# Patient Record
Sex: Female | Born: 1985 | Race: Black or African American | Hispanic: No | Marital: Single | Smoking: Current some day smoker
Health system: Southern US, Community
[De-identification: ages and names within clinical notes are randomized; demographics above are authoritative.]

## PROBLEM LIST (undated history)

## (undated) HISTORY — PX: TONSILLECTOMY: SUR1361

---

## 2011-08-31 ENCOUNTER — Emergency Department (HOSPITAL_COMMUNITY)
Admission: EM | Admit: 2011-08-31 | Discharge: 2011-08-31 | Disposition: A | Discharge status: Home / Self Care | Payer: Self-pay | Attending: Emergency Medicine | Admitting: Emergency Medicine

## 2011-08-31 ENCOUNTER — Encounter: Payer: Self-pay | Admitting: Adult Health

## 2011-08-31 DIAGNOSIS — B029 Zoster without complications: Secondary | ICD-10-CM | POA: Insufficient documentation

## 2011-08-31 DIAGNOSIS — F17200 Nicotine dependence, unspecified, uncomplicated: Secondary | ICD-10-CM | POA: Insufficient documentation

## 2011-08-31 MED ORDER — OXYCODONE-ACETAMINOPHEN 5-325 MG PO TABS: 1.0000 | ORAL_TABLET | Freq: Four times a day (QID) | ORAL | Status: AC | PRN

## 2011-08-31 MED ORDER — VALACYCLOVIR HCL 500 MG PO TABS: 1000.0000 mg | ORAL_TABLET | Freq: Three times a day (TID) | ORAL | Status: AC

## 2011-08-31 NOTE — ED Provider Notes (Signed)
History     CSN: [id number] Arrival date & time: 08/31/2011  1:41 PM   None     Chief Complaint  Patient presents with  . Herpes Zoster    (Consider location/radiation/quality/duration/timing/severity/associated sxs/prior treatment) HPI Comments: Patient states that she has (it red bumps in her upper left torso.  She felt a burning sensation days before the rash recurred.  Now it itching and causing pain.  Patient states that she did have chickenpox as a child.  Patient has never had a rash like this before in the past and she thinks it shingles.  Patient denies fever, night sweats, chills.  She also denies the use of any new lotions or other products.  Patient denies any difficulty breathing and chest pain.  Patient has no other complaints   Patient is a 25 y.o. female presenting with rash. The history is provided by the patient.  Rash  This is a new problem. The current episode started 2 days ago. The problem has been gradually worsening. The problem is associated with nothing. There has been no fever. The rash is present on the torso. The pain is at a severity of 5/10. The pain is mild. The pain has been constant since onset. Associated symptoms include itching and pain. She has tried nothing for the symptoms.    Past Medical History  Diagnosis Date  . Asthma     History reviewed. No pertinent past surgical history.  History reviewed. No pertinent family history.  History  Substance Use Topics  . Smoking status: Current Everyday Smoker  . Smokeless tobacco: Not on file  . Alcohol Use: Yes    OB History    Grav Para Term Preterm Abortions TAB SAB Ect Mult Living                  Review of Systems  Skin: Positive for itching and rash.  All other systems reviewed and are negative.    Allergies  Review of patient's allergies indicates no known allergies.  Home Medications  No current outpatient prescriptions on file.  BP 109/60  Pulse 79  Temp 97.9 F (36.6  C)  Resp 20  SpO2 100%  Physical Exam  Nursing note and vitals reviewed. Constitutional: She is oriented to person, place, and time. She appears well-developed and well-nourished. No distress.  HENT:  Head: Normocephalic and atraumatic.  Eyes:       Normal appearance  Neck: Normal range of motion.  Pulmonary/Chest: Effort normal.  Neurological: She is alert and oriented to person, place, and time.  Skin: Skin is warm, dry and intact. Rash noted. Rash is macular and papular.     Psychiatric: She has a normal mood and affect. Her behavior is normal.    ED Course  Procedures (including critical care time)  Labs Reviewed - No data to display No results found.   No diagnosis found.  Patient being treated for possible shingles.  She has been instructed to followup with her PCP or dermatology in regards to her visit today.  The patient will be getting a work note because she works in [employer]  Discussed length of time for wk note with Dr. Ignacia Palma who suggests I write it for 3-4 days.  MDM  Shingles   Medical screening examination/treatment/procedure(s) were performed by non-physician practitioner and as supervising physician I was immediately available for consultation/collaboration. Osvaldo Human, M.D.     [place], [place] 08/31/11 1511  Carleene Cooper III, MD 08/31/11  1954 

## 2011-08-31 NOTE — ED Notes (Signed)
Raised red bumps on left upper chest unilateral. Complains of itching denies pain.

## 2014-10-20 ENCOUNTER — Encounter (HOSPITAL_COMMUNITY): Payer: Self-pay | Admitting: Emergency Medicine

## 2014-10-20 ENCOUNTER — Emergency Department (HOSPITAL_COMMUNITY)
Admission: EM | Admit: 2014-10-20 | Discharge: 2014-10-21 | Disposition: A | Discharge status: Home / Self Care | Payer: BLUE CROSS/BLUE SHIELD | Attending: Emergency Medicine | Admitting: Emergency Medicine

## 2014-10-20 DIAGNOSIS — R110 Nausea: Secondary | ICD-10-CM | POA: Insufficient documentation

## 2014-10-20 DIAGNOSIS — Z79899 Other long term (current) drug therapy: Secondary | ICD-10-CM | POA: Insufficient documentation

## 2014-10-20 DIAGNOSIS — R51 Headache: Secondary | ICD-10-CM | POA: Insufficient documentation

## 2014-10-20 DIAGNOSIS — J45909 Unspecified asthma, uncomplicated: Secondary | ICD-10-CM | POA: Insufficient documentation

## 2014-10-20 DIAGNOSIS — Z87891 Personal history of nicotine dependence: Secondary | ICD-10-CM | POA: Insufficient documentation

## 2014-10-20 DIAGNOSIS — Z7951 Long term (current) use of inhaled steroids: Secondary | ICD-10-CM | POA: Diagnosis not present

## 2014-10-20 DIAGNOSIS — R519 Headache, unspecified: Secondary | ICD-10-CM

## 2014-10-20 NOTE — ED Notes (Signed)
Patient is concerned about her bowel movements on Thursday. Pt describes the color of stool was "lime green or dull poop". Since then, she has had a normal color bowel movement. Pt states she has been to her PCP for this before but states "no one has done anything for me".

## 2014-10-20 NOTE — ED Notes (Signed)
Patient complains of right occipital pain. Described as soreness and throbbing. Reports blurry vision but denies dizziness.

## 2014-10-20 NOTE — ED Notes (Signed)
Pt states she has been nauseated for some time now and is having pain on the right side of her head  Pt states the pain started 2 days ago  Pt states the pain is going to the top of her head and down the right side of her neck  Pt states [facility]  Pt states she was sent home with amoxicillin but not sure why she was given that and the pain was not addressed

## 2014-10-21 ENCOUNTER — Emergency Department (HOSPITAL_COMMUNITY): Payer: BLUE CROSS/BLUE SHIELD

## 2014-10-21 MED ORDER — METOCLOPRAMIDE HCL 5 MG/ML IJ SOLN
10.0000 mg | Freq: Once | INTRAMUSCULAR | Status: AC
  Administered 2014-10-21: 10 mg via INTRAVENOUS
  Filled 2014-10-21: qty 2

## 2014-10-21 MED ORDER — KETOROLAC TROMETHAMINE 30 MG/ML IJ SOLN
30.0000 mg | Freq: Once | INTRAMUSCULAR | Status: AC
  Administered 2014-10-21: 30 mg via INTRAVENOUS
  Filled 2014-10-21: qty 1

## 2014-10-21 MED ORDER — NAPROXEN 500 MG PO TABS: 500.0000 mg | ORAL_TABLET | Freq: Two times a day (BID) | ORAL | Status: AC

## 2014-10-21 MED ORDER — DIPHENHYDRAMINE HCL 50 MG/ML IJ SOLN
25.0000 mg | Freq: Once | INTRAMUSCULAR | Status: AC
  Administered 2014-10-21: 25 mg via INTRAVENOUS
  Filled 2014-10-21: qty 1

## 2014-10-21 NOTE — ED Notes (Signed)
Patient has been transported to hallway C to wait for the Benadryl to wear off. Mickel FuchsKellee RN got [place] from [place], [place]. Mickel FuchsKellee, RN has no questions.

## 2014-10-21 NOTE — ED Provider Notes (Signed)
[id number] - [facility] at shift change. Patient presenting for pain in her posterior scalp at the site of a "knot". CT imaging pending. Plan discussed with Cartner, PA-C which includes discharge if imaging negative. CT results reviewed by this Clinical research associatewriter. Scan today is unremarkable and shows no evidence of hemorrhage, mass effect, hydrocephalus, or lesion. When asked how her headache feels, patient states, "It's not a headache. It's a knot that was throbbing, but the throbbing is gone now." No focal neurologic deficits appreciated during my encounter. No indication for further emergent workup at this time. Patient stable for discharge with prescription for naproxen. Primary care follow-up advised and return precautions given. Patient agreeable to plan with no unaddressed concerns.   Filed Vitals:   10/20/14 2314 10/21/14 0213  BP: 131/74 115/62  Pulse: 75 75  Temp: 97.8 F (36.6 C) 98.5 F (36.9 C)  TempSrc: Oral Oral  Resp: 18 20  SpO2: 100% 98%    Ct Head Wo Contrast  10/21/2014   CLINICAL DATA:  Initial evaluation for acute headache, nausea.  EXAM: CT HEAD WITHOUT CONTRAST  TECHNIQUE: Contiguous axial images were obtained from the base of the skull through the vertex without intravenous contrast.  COMPARISON:  None.  FINDINGS: There is no acute intracranial hemorrhage or infarct. No mass lesion or midline shift. Gray-white matter differentiation is well maintained. Ventricles are normal in size without evidence of hydrocephalus. CSF containing spaces are within normal limits. No extra-axial fluid collection.  The calvarium is intact.  Orbital soft tissues are within normal limits.  The paranasal sinuses and mastoid air cells are well pneumatized and free of fluid.  Scalp soft tissues are unremarkable.  IMPRESSION: Normal head CT with no acute intracranial abnormality.   Electronically Signed   By: Rise MuBenjamin  McClintock M.D.   On: 10/21/2014 02:07      Antony MaduraKelly Solan Vosler,  PA-C 10/21/14 [id number]  Olivia Mackielga M Otter, MD 10/21/14 [phone]

## 2014-10-21 NOTE — ED Provider Notes (Signed)
CSN: [id number]     Arrival date & time 10/20/14  2304 History   First MD Initiated Contact with Patient 10/21/14 0026     Chief Complaint  Patient presents with  . Headache  . Nausea     (Consider location/radiation/quality/duration/timing/severity/associated sxs/prior Treatment) HPI Lynn Diaz is a 29 y.o. female because in for evaluation of headache and nausea. Patient states Saturday she had a "knot" show up on the back of her head on the right side that has gotten increasingly tender and will sometimes shoot sharp pains around the back of her head. She characterizes the sensation as a throbbing and squeezing whenever she turns her head a certain direction. She rates the discomfort as a 9/10. She has taken ibuprofen and Tylenol without relief. She reports an isolated episode of blurred vision on Thursday that has since resolved. She reports nausea for the past 6 months without vomiting. She also reports intermittent bowel movements that sometimes appear green. Denies any melena or hematochezia. She denies chest pain, short breath, cough, abdominal pain, diarrhea or constipation, numbness or weakness, urinary symptoms, syncope. Reports that she was seen [facility] for the same complaint, but was not satisfied with her treatment. Presents here today and requests a CT scan of her head.  Past Medical History  Diagnosis Date  . Asthma    Past Surgical History  Procedure Laterality Date  . Tonsillectomy     Family History  Problem Relation Age of Onset  . Asthma Other   . Stroke Other   . Hypertension Other   . Diabetes Other    History  Substance Use Topics  . Smoking status: Former Games developermoker  . Smokeless tobacco: Not on file  . Alcohol Use: Yes   OB History    No data available     Review of Systems  All other systems reviewed and are negative. A 10 point review of systems was completed and was negative except for pertinent positives and negatives  as mentioned in the history of present illness      Allergies  Review of patient's allergies indicates no known allergies.  Home Medications   Prior to Admission medications   Medication Sig Start Date End Date Taking? Authorizing Provider  albuterol (PROVENTIL HFA;VENTOLIN HFA) 108 (90 BASE) MCG/ACT inhaler Inhale 1 puff into the lungs every 6 (six) hours as needed for wheezing or shortness of breath.   Yes Historical Provider, MD  beclomethasone (QVAR) 40 MCG/ACT inhaler Inhale 1 puff into the lungs 2 (two) times daily.   Yes Historical Provider, MD   BP 131/74 mmHg  Pulse 75  Temp(Src) 97.8 F (36.6 C) (Oral)  Resp 18  SpO2 100% Physical Exam  Constitutional: She is oriented to person, place, and time. She appears well-developed and well-nourished.  HENT:  Head: Normocephalic and atraumatic.  Mouth/Throat: Oropharynx is clear and moist.  Tenderness to right occiput with no overt lesions or deformities. No erythema. No rashes.  Eyes: Conjunctivae are normal. Pupils are equal, round, and reactive to light. Right eye exhibits no discharge. Left eye exhibits no discharge. No scleral icterus.  Neck: Normal range of motion. Neck supple. No tracheal deviation present.  No meningismus  Cardiovascular: Normal rate, regular rhythm and normal heart sounds.   Pulmonary/Chest: Effort normal and breath sounds normal. No respiratory distress. She has no wheezes. She has no rales.  Abdominal: Soft. There is no tenderness.  Musculoskeletal: She exhibits no tenderness.  Lymphadenopathy:    She  has no cervical adenopathy.  Neurological: She is alert and oriented to person, place, and time.  Cranial Nerves II-XII grossly intact. Motor and sensation 5/5 in all 4 remedies. Extraocular movements intact without nystatin mass. Performs finger-to-nose coordination movements without difficulty. Gait is baseline without any ataxia.  Skin: Skin is warm and dry. No rash noted.  Psychiatric: She has a  normal mood and affect.  Nursing note and vitals reviewed.   ED Course  Procedures (including critical care time) Labs Review Labs Reviewed - No data to display  Imaging Review No results found.   EKG Interpretation None     Meds given in ED:  Medications  ketorolac (TORADOL) 30 MG/ML injection 30 mg (not administered)  metoCLOPramide (REGLAN) injection 10 mg (not administered)  diphenhydrAMINE (BENADRYL) injection 25 mg (not administered)    New Prescriptions   No medications on file   Filed Vitals:   10/20/14 2314  BP: 131/74  Pulse: 75  Temp: 97.8 F (36.6 C)  TempSrc: Oral  Resp: 18  SpO2: 100%    MDM  Vitals stable - WNL -afebrile Pt resting comfortably in ED. In no apparent distress.  PE--Normal neuro exam Imaging--Pending CT head  PT with "knot" on back of her head that arose two days ago. Clinical picture not consistent with SAH, meningitis, Dural venous thrombosis. Will obtain CT head to confirm. Low concern for any other acute or emergent pathology at this time. Pt reports she has easy access to care and good follow up with her PCP.  Care transferred to [facility], PA-C. Plan is CT head, if normal I feel patient is stable to be discharged home and follow-up with her primary care.  Final diagnoses:  Nonintractable headache, unspecified chronicity pattern, unspecified headache type        Sharlene Motts, PA-C 10/21/14 1043  Olivia Mackie, MD 10/22/14 [phone]

## 2014-10-21 NOTE — ED Notes (Signed)
Pt in CT.

## 2014-10-21 NOTE — Discharge Instructions (Signed)

## 2015-09-21 IMAGING — CT CT HEAD W/O CM
2 series · 16 of 30 positions shown, 20 images · non-contrast
Comparison: None.

CLINICAL DATA: Initial evaluation for acute headache, nausea.

EXAM:
CT HEAD WITHOUT CONTRAST
TECHNIQUE: Contiguous axial images were obtained from the base of the skull
through the vertex without intravenous contrast.

[Series 2: head w/o · axial · non-contrast · 0.45mm/px · z∈[-152,-32]mm · 13 of 29 slices shown, 17 images]
[im 3/29  brain]
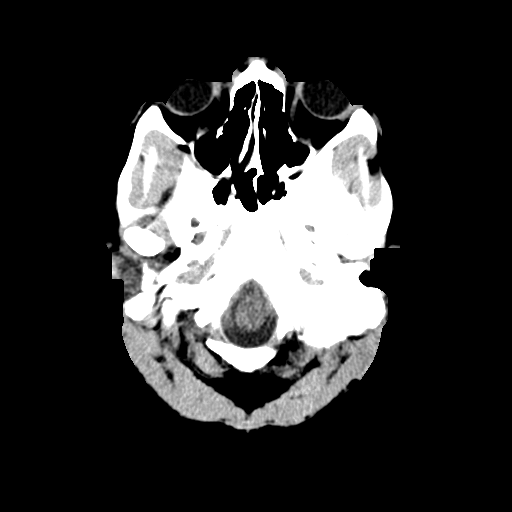
[im 3/29  bone]
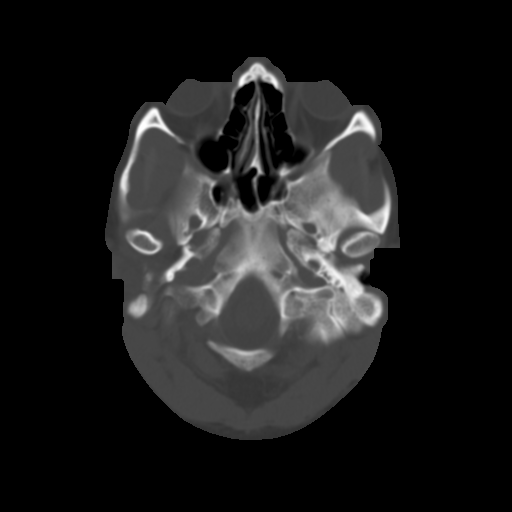
[im 5/29  brain]
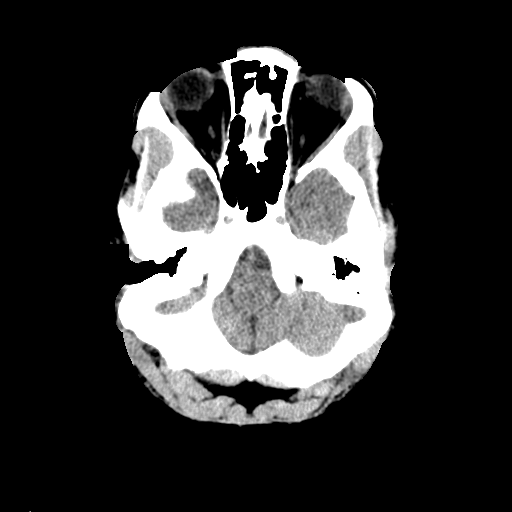
[im 7/29  brain]
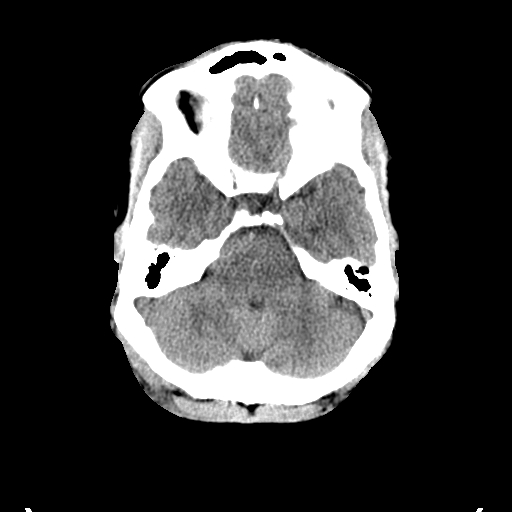
[im 9/29  brain]
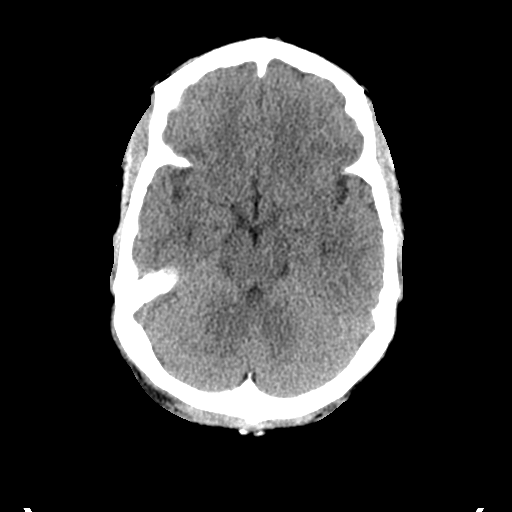
[im 11/29  brain]
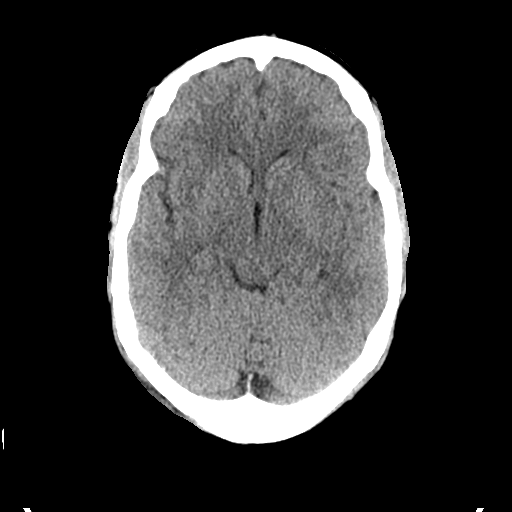
[im 11/29  bone]
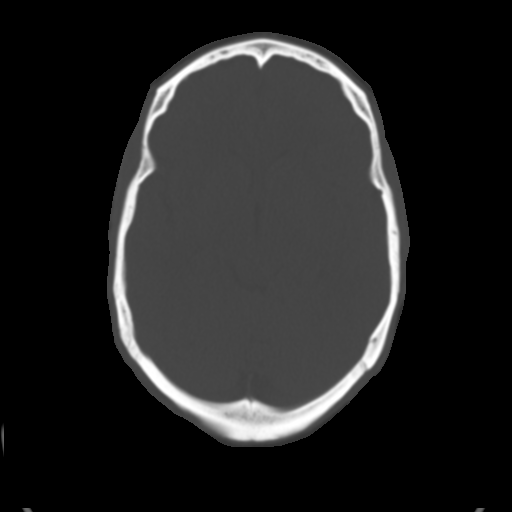
[im 13/29  brain]
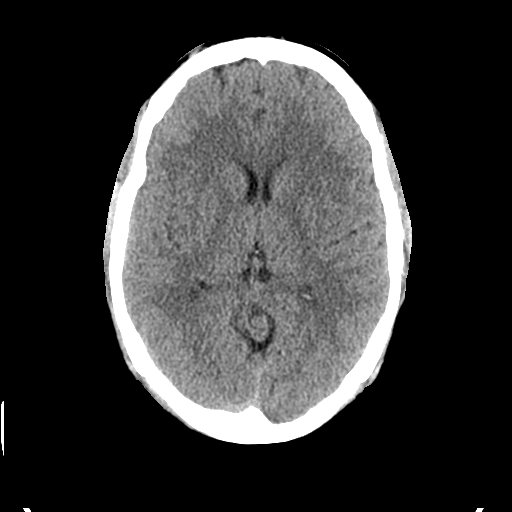
[im 15/29  brain]
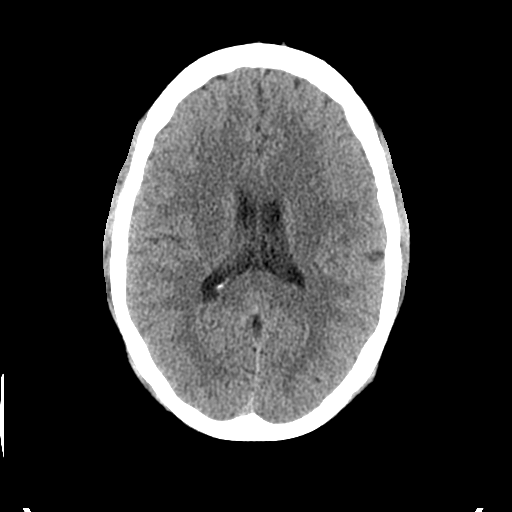
[im 17/29  brain]
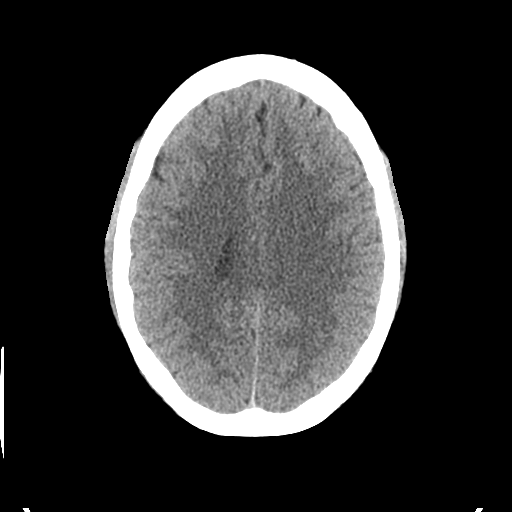
[im 19/29  brain]
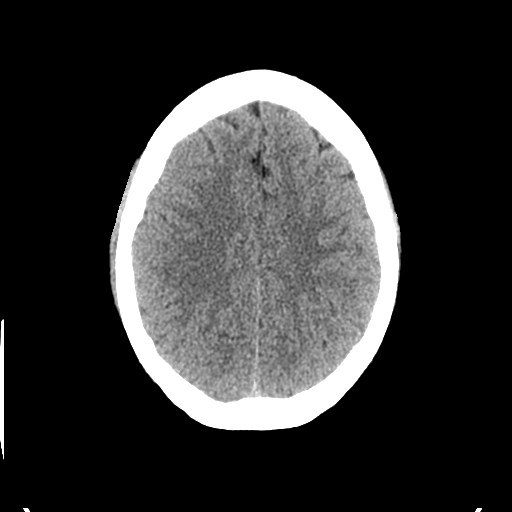
[im 19/29  bone]
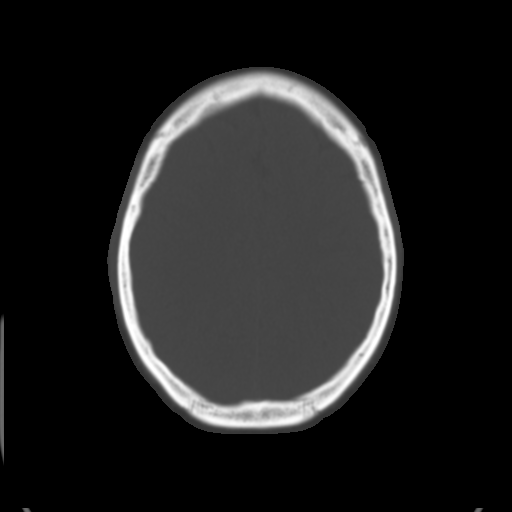
[im 21/29  brain]
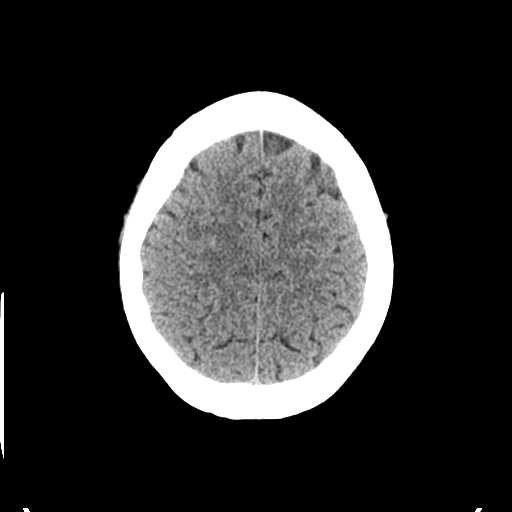
[im 23/29  brain]
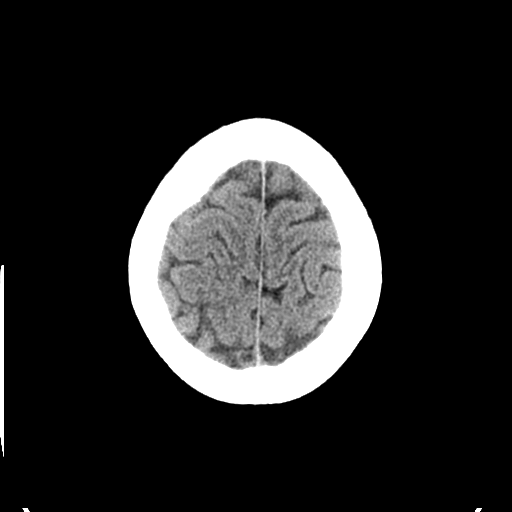
[im 25/29  brain]
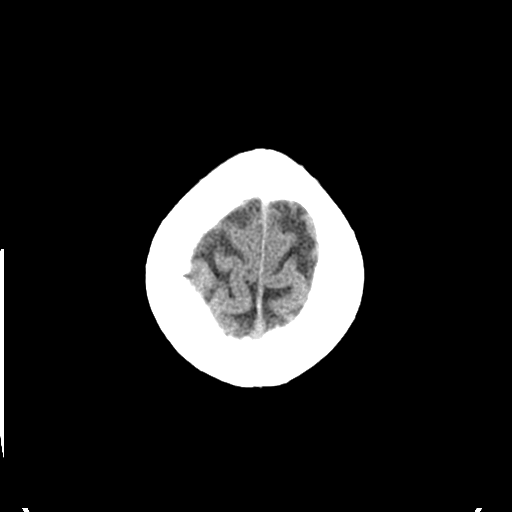
[im 27/29  brain]
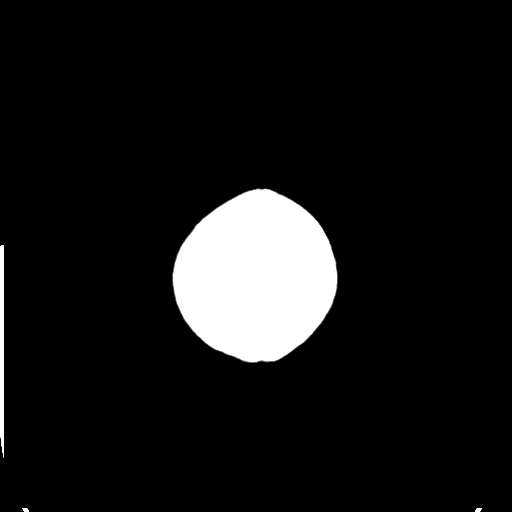
[im 27/29  bone]
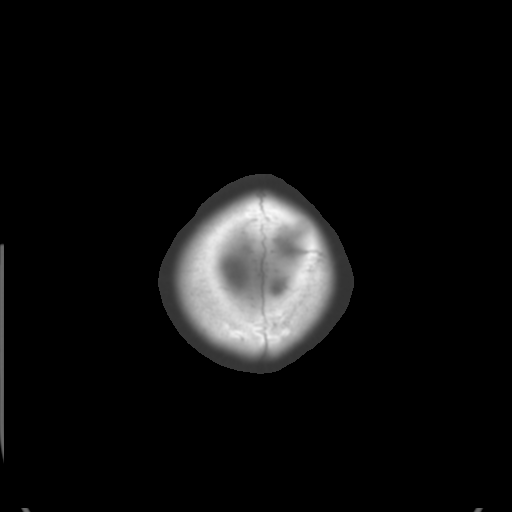

[Series 3: bone windows · axial · 0.45mm/px · z∈[-152,-112]mm · 3 of 29 slices shown]
[im 3/29  bone]
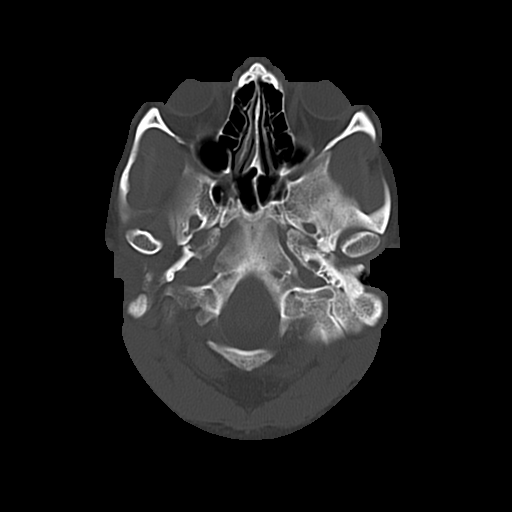
[im 7/29  bone]
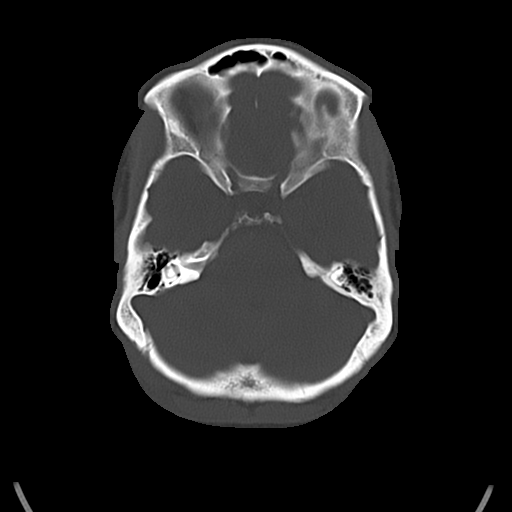
[im 11/29  bone]
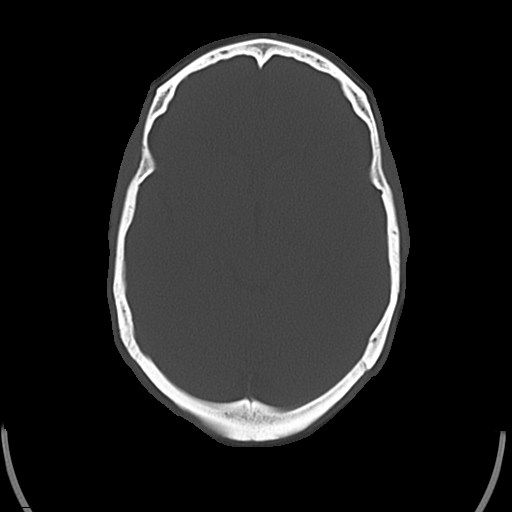

[16 of 30 positions shown; findings below may reference images not displayed]

FINDINGS: There is no acute intracranial hemorrhage or infarct. No mass lesion
or midline shift. Gray-white matter differentiation is well
maintained. Ventricles are normal in size without evidence of
hydrocephalus. CSF containing spaces are within normal limits. No
extra-axial fluid collection.

The calvarium is intact.

Orbital soft tissues are within normal limits.

The paranasal sinuses and mastoid air cells are well pneumatized and
free of fluid.

Scalp soft tissues are unremarkable.
IMPRESSION: Normal head CT with no acute intracranial abnormality.

## 2016-03-12 ENCOUNTER — Emergency Department (HOSPITAL_COMMUNITY): Payer: BLUE CROSS/BLUE SHIELD

## 2016-03-12 ENCOUNTER — Encounter (HOSPITAL_COMMUNITY): Payer: Self-pay | Admitting: Emergency Medicine

## 2016-03-12 ENCOUNTER — Emergency Department (HOSPITAL_COMMUNITY)
Admission: EM | Admit: 2016-03-12 | Discharge: 2016-03-12 | Disposition: A | Discharge status: Home / Self Care | Payer: BLUE CROSS/BLUE SHIELD | Attending: Emergency Medicine | Admitting: Emergency Medicine

## 2016-03-12 DIAGNOSIS — R197 Diarrhea, unspecified: Secondary | ICD-10-CM | POA: Diagnosis not present

## 2016-03-12 DIAGNOSIS — Z7951 Long term (current) use of inhaled steroids: Secondary | ICD-10-CM | POA: Diagnosis not present

## 2016-03-12 DIAGNOSIS — J45909 Unspecified asthma, uncomplicated: Secondary | ICD-10-CM | POA: Insufficient documentation

## 2016-03-12 DIAGNOSIS — F17200 Nicotine dependence, unspecified, uncomplicated: Secondary | ICD-10-CM | POA: Insufficient documentation

## 2016-03-12 DIAGNOSIS — R1012 Left upper quadrant pain: Secondary | ICD-10-CM | POA: Diagnosis present

## 2016-03-12 LAB — URINALYSIS, ROUTINE W REFLEX MICROSCOPIC
BILIRUBIN URINE: NEGATIVE
GLUCOSE, UA: NEGATIVE mg/dL
HGB URINE DIPSTICK: NEGATIVE
KETONES UR: NEGATIVE mg/dL
Nitrite: NEGATIVE
PH: 6 (ref 5.0–8.0)
PROTEIN: NEGATIVE mg/dL
Specific Gravity, Urine: 1.021 (ref 1.005–1.030)

## 2016-03-12 LAB — COMPREHENSIVE METABOLIC PANEL
ALBUMIN: 4.2 g/dL (ref 3.5–5.0)
ALT: 16 U/L (ref 14–54)
AST: 21 U/L (ref 15–41)
Alkaline Phosphatase: 78 U/L (ref 38–126)
Anion gap: 6 (ref 5–15)
BUN: 8 mg/dL (ref 6–20)
CHLORIDE: 105 mmol/L (ref 101–111)
CO2: 26 mmol/L (ref 22–32)
CREATININE: 0.86 mg/dL (ref 0.44–1.00)
Calcium: 9.3 mg/dL (ref 8.9–10.3)
GFR calc Af Amer: >60 mL/min (ref >60)
GLUCOSE: 92 mg/dL (ref 65–99)
POTASSIUM: 3.9 mmol/L (ref 3.5–5.1)
Sodium: 137 mmol/L (ref 135–145)
Total Bilirubin: 0.3 mg/dL (ref 0.3–1.2)
Total Protein: 8.1 g/dL (ref 6.5–8.1)

## 2016-03-12 LAB — URINE MICROSCOPIC-ADD ON: RBC / HPF: NONE SEEN RBC/hpf (ref 0–5)

## 2016-03-12 LAB — CBC
HEMATOCRIT: 37.8 % (ref 36.0–46.0)
Hemoglobin: 13.2 g/dL (ref 12.0–15.0)
MCH: 28.8 pg (ref 26.0–34.0)
MCHC: 34.9 g/dL (ref 30.0–36.0)
MCV: 82.5 fL (ref 78.0–100.0)
PLATELETS: 297 10*3/uL (ref 150–400)
RBC: 4.58 MIL/uL (ref 3.87–5.11)
RDW: 12.4 % (ref 11.5–15.5)
WBC: 10.8 10*3/uL — AB (ref 4.0–10.5)

## 2016-03-12 LAB — I-STAT BETA HCG BLOOD, ED (MC, WL, AP ONLY): I-stat hCG, quantitative: <5 m[IU]/mL (ref <5)

## 2016-03-12 LAB — LIPASE, BLOOD: LIPASE: 26 U/L (ref 11–51)

## 2016-03-12 MED ORDER — ONDANSETRON 8 MG PO TBDP: ORAL_TABLET | ORAL | Status: AC

## 2016-03-12 MED ORDER — DICYCLOMINE HCL 20 MG PO TABS: 20.0000 mg | ORAL_TABLET | Freq: Two times a day (BID) | ORAL | Status: AC

## 2016-03-12 MED ORDER — ONDANSETRON 8 MG PO TBDP
8.0000 mg | ORAL_TABLET | Freq: Once | ORAL | Status: AC
  Administered 2016-03-12: 8 mg via ORAL
  Filled 2016-03-12: qty 1

## 2016-03-12 MED ORDER — SUCRALFATE 1 GM/10ML PO SUSP: 1.0000 g | Freq: Three times a day (TID) | ORAL | Status: AC

## 2016-03-12 MED ORDER — KETOROLAC TROMETHAMINE 60 MG/2ML IM SOLN
60.0000 mg | Freq: Once | INTRAMUSCULAR | Status: DC
  Filled 2016-03-12: qty 2

## 2016-03-12 MED ORDER — DICYCLOMINE HCL 10 MG/ML IM SOLN
20.0000 mg | Freq: Once | INTRAMUSCULAR | Status: DC
  Filled 2016-03-12: qty 2

## 2016-03-12 MED ORDER — GI COCKTAIL ~~LOC~~
30.0000 mL | Freq: Once | ORAL | Status: DC
  Filled 2016-03-12: qty 30

## 2016-03-12 NOTE — ED Notes (Signed)
Pt c/o LUQ pain, nausea and diarrhea 15-20 x in the last 24 hours, all liquid. Pt originally began with same issue last August, pt has been to PCP several times with neg GB U/S.

## 2016-03-12 NOTE — ED Provider Notes (Signed)
CSN: [id number]     Arrival date & time 03/12/16  0053 History   First MD Initiated Contact with Patient 03/12/16 0532     Chief Complaint  Patient presents with  . Abdominal Pain     (Consider location/radiation/quality/duration/timing/severity/associated sxs/prior Treatment) Patient is a 30 y.o. female presenting with cramps. The history is provided by the patient. No language interpreter was used.  Abdominal Cramping This is a chronic problem. The current episode started more than 1 week ago (10 months ). The problem occurs constantly. The problem has not changed since onset.Pertinent negatives include no chest pain, no headaches and no shortness of breath. Nothing aggravates the symptoms. Nothing relieves the symptoms. She has tried nothing for the symptoms. The treatment provided no relief.  Intermittent diarrhea and nausea has an appointment with GI on 6/29  Past Medical History  Diagnosis Date  . Asthma    Past Surgical History  Procedure Laterality Date  . Tonsillectomy     Family History  Problem Relation Age of Onset  . Asthma Other   . Stroke Other   . Hypertension Other   . Diabetes Other    Social History  Substance Use Topics  . Smoking status: Current Some Day Smoker  . Smokeless tobacco: None  . Alcohol Use: Yes   OB History    No data available     Review of Systems  Respiratory: Negative for shortness of breath.   Cardiovascular: Negative for chest pain.  Neurological: Negative for headaches.  All other systems reviewed and are negative.     Allergies  Review of patient's allergies indicates no known allergies.  Home Medications   Prior to Admission medications   Medication Sig Start Date End Date Taking? Authorizing Provider  albuterol (PROVENTIL HFA;VENTOLIN HFA) 108 (90 BASE) MCG/ACT inhaler Inhale 1 puff into the lungs every 6 (six) hours as needed for wheezing or shortness of breath.   Yes Historical Provider, MD  beclomethasone (QVAR)  40 MCG/ACT inhaler Inhale 2 puffs into the lungs daily.    Yes Historical Provider, MD  metroNIDAZOLE (FLAGYL) 500 MG tablet Take 1 tablet by mouth 2 (two) times daily. 03/09/16   Historical Provider, MD  naproxen (NAPROSYN) 500 MG tablet Take 1 tablet (500 mg total) by mouth 2 (two) times daily. Patient not taking: Reported on 03/12/2016 10/21/14   Antony MaduraKelly Humes, PA-C   BP 111/68 mmHg  Pulse 74  Temp(Src) 97.8 F (36.6 C) (Oral)  Resp 18  Ht 5\' 1"  (1.549 m)  SpO2 94%  LMP 02/29/2016 Physical Exam  Constitutional: She is oriented to person, place, and time. She appears well-developed and well-nourished. No distress.  HENT:  Head: Normocephalic and atraumatic.  Mouth/Throat: Oropharynx is clear and moist.  Eyes: Conjunctivae are normal. Pupils are equal, round, and reactive to light.  Neck: Normal range of motion. Neck supple.  Cardiovascular: Normal rate, regular rhythm and intact distal pulses.   Pulmonary/Chest: Effort normal and breath sounds normal. No respiratory distress. She has no wheezes. She has no rales.  Abdominal: Soft. Bowel sounds are normal. She exhibits no distension and no mass. There is no tenderness. There is no rebound and no guarding.  Musculoskeletal: Normal range of motion.  Neurological: She is alert and oriented to person, place, and time.  Skin: Skin is warm and dry.  Psychiatric: She has a normal mood and affect.    ED Course  Procedures (including critical care time) Labs Review Labs Reviewed  CBC - Abnormal; Notable  for the following:    WBC 10.8 (*)    All other components within normal limits  LIPASE, BLOOD  COMPREHENSIVE METABOLIC PANEL  URINALYSIS, ROUTINE W REFLEX MICROSCOPIC (NOT AT [facility])  I-STAT BETA HCG BLOOD, ED ([facility], [facility], [facility] ONLY)    Imaging Review Dg Abd Acute W/chest  03/12/2016  CLINICAL DATA:  30 year old female with left upper quadrant abdominal pain and nausea. EXAM: DG ABDOMEN ACUTE W/ 1V CHEST COMPARISON:  CT of the abdomen pelvis  dated 03/10/2016 FINDINGS: The lungs are clear. There is no pleural effusion or pneumothorax. The cardiac silhouette is within normal limits. No acute osseous pathology. There is no bowel dilatation or evidence of obstruction. Air is noted throughout the colon. There is liquid content with air-fluid level within the colon compatible with diarrheal state. Correlation with clinical exam and stool cultures recommended. No radiopaque calculi or foreign object. The soft tissues and osseous structures appear unremarkable. IMPRESSION: No acute cardiopulmonary process. No evidence of bowel obstruction. Diarrheal state. Electronically Signed   By: Elgie CollardArash  Radparvar M.D.   On: 03/12/2016 06:16   I have personally reviewed and evaluated these images and lab results as part of my medical decision-making.   EKG Interpretation None      MDM   Final diagnoses:  None    Filed Vitals:   03/12/16 0104 03/12/16 0609  BP: 114/76 111/68  Pulse: 68 74  Temp: 97.8 F (36.6 C)   Resp: 18 18    Results for orders placed or performed during the hospital encounter of 03/12/16  Lipase, blood  Result Value Ref Range   Lipase 26 11 - 51 U/L  Comprehensive metabolic panel  Result Value Ref Range   Sodium 137 135 - 145 mmol/L   Potassium 3.9 3.5 - 5.1 mmol/L   Chloride 105 101 - 111 mmol/L   CO2 26 22 - 32 mmol/L   Glucose, Bld 92 65 - 99 mg/dL   BUN 8 6 - 20 mg/dL   Creatinine, Ser 1.610.86 0.44 - 1.00 mg/dL   Calcium 9.3 8.9 - 09.610.3 mg/dL   Total Protein 8.1 6.5 - 8.1 g/dL   Albumin 4.2 3.5 - 5.0 g/dL   AST 21 15 - 41 U/L   ALT 16 14 - 54 U/L   Alkaline Phosphatase 78 38 - 126 U/L   Total Bilirubin 0.3 0.3 - 1.2 mg/dL   GFR calc non Af Amer >60 >60 mL/min   GFR calc Af Amer >60 >60 mL/min   Anion gap 6 5 - 15  CBC  Result Value Ref Range   WBC 10.8 (H) 4.0 - 10.5 K/uL   RBC 4.58 3.87 - 5.11 MIL/uL   Hemoglobin 13.2 12.0 - 15.0 g/dL   HCT 04.537.8 40.936.0 - 81.146.0 %   MCV 82.5 78.0 - 100.0 fL   MCH 28.8 26.0  - 34.0 pg   MCHC 34.9 30.0 - 36.0 g/dL   RDW 91.412.4 78.211.5 - 95.615.5 %   Platelets 297 150 - 400 K/uL  I-Stat beta hCG blood, ED  Result Value Ref Range   I-stat hCG, quantitative <5.0 <5 mIU/mL   Comment 3           Dg Abd Acute W/chest  03/12/2016  CLINICAL DATA:  30 year old female with left upper quadrant abdominal pain and nausea. EXAM: DG ABDOMEN ACUTE W/ 1V CHEST COMPARISON:  CT of the abdomen pelvis dated 03/10/2016 FINDINGS: The lungs are clear. There is no pleural effusion or pneumothorax. The cardiac  silhouette is within normal limits. No acute osseous pathology. There is no bowel dilatation or evidence of obstruction. Air is noted throughout the colon. There is liquid content with air-fluid level within the colon compatible with diarrheal state. Correlation with clinical exam and stool cultures recommended. No radiopaque calculi or foreign object. The soft tissues and osseous structures appear unremarkable. IMPRESSION: No acute cardiopulmonary process. No evidence of bowel obstruction. Diarrheal state. Electronically Signed   By: Elgie Collard M.D.   On: 03/12/2016 06:16    Medications  gi cocktail (Maalox,Lidocaine,Donnatal) (30 mLs Oral Not Given 03/12/16 0615)  dicyclomine (BENTYL) injection 20 mg (20 mg Intramuscular Not Given 03/12/16 0616)  ketorolac (TORADOL) injection 60 mg (60 mg Intramuscular Not Given 03/12/16 0620)  ondansetron (ZOFRAN-ODT) disintegrating tablet 8 mg (8 mg Oral Given 03/12/16 0612)    Refused all medication except zofran.  Well appearing, vitals and labs and xray are benign and reassuring.  Stable for discharge with close follow up with HGI.  Bland diet.   Will d/c with carafate and bentyl.   Strict return precautions given    Tashima Scarpulla, MD 03/12/16 [phone]

## 2017-02-10 IMAGING — CR DG ABDOMEN ACUTE W/ 1V CHEST
3 series · 3 of 3 positions shown · non-contrast
Comparison: CT of the abdomen pelvis dated 03/10/2016

CLINICAL DATA: 30-year-old female with left upper quadrant
abdominal pain and nausea.

EXAM:
DG ABDOMEN ACUTE W/ 1V CHEST

[w chest pa]
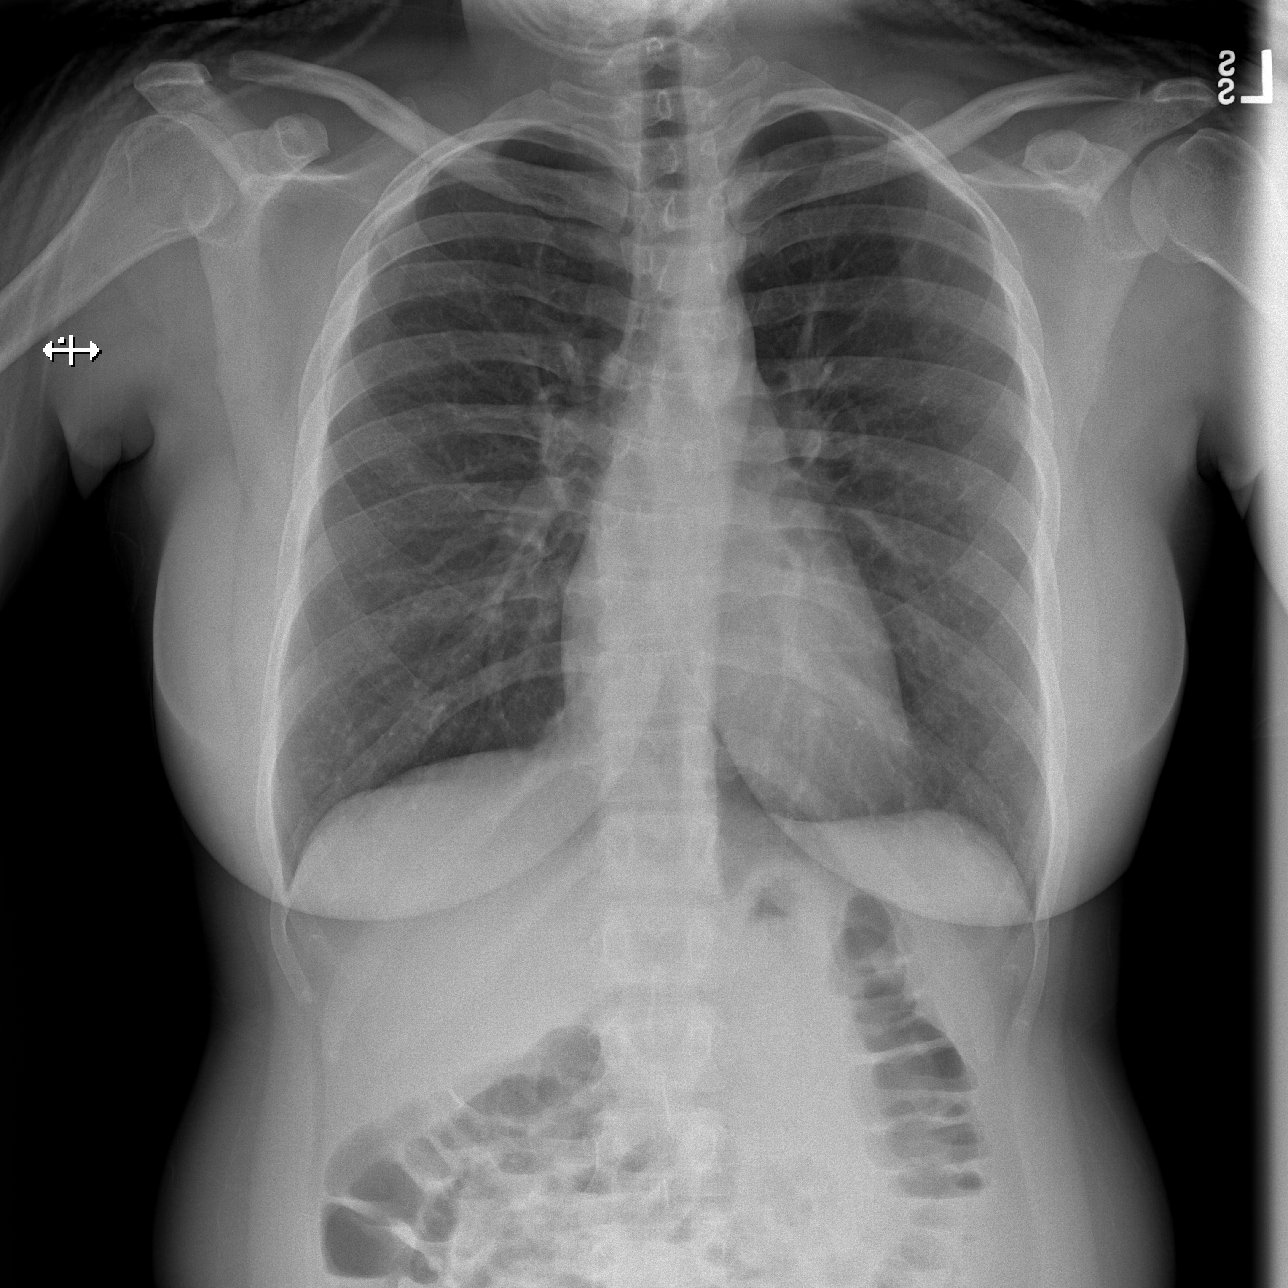

[w abdomen upright]
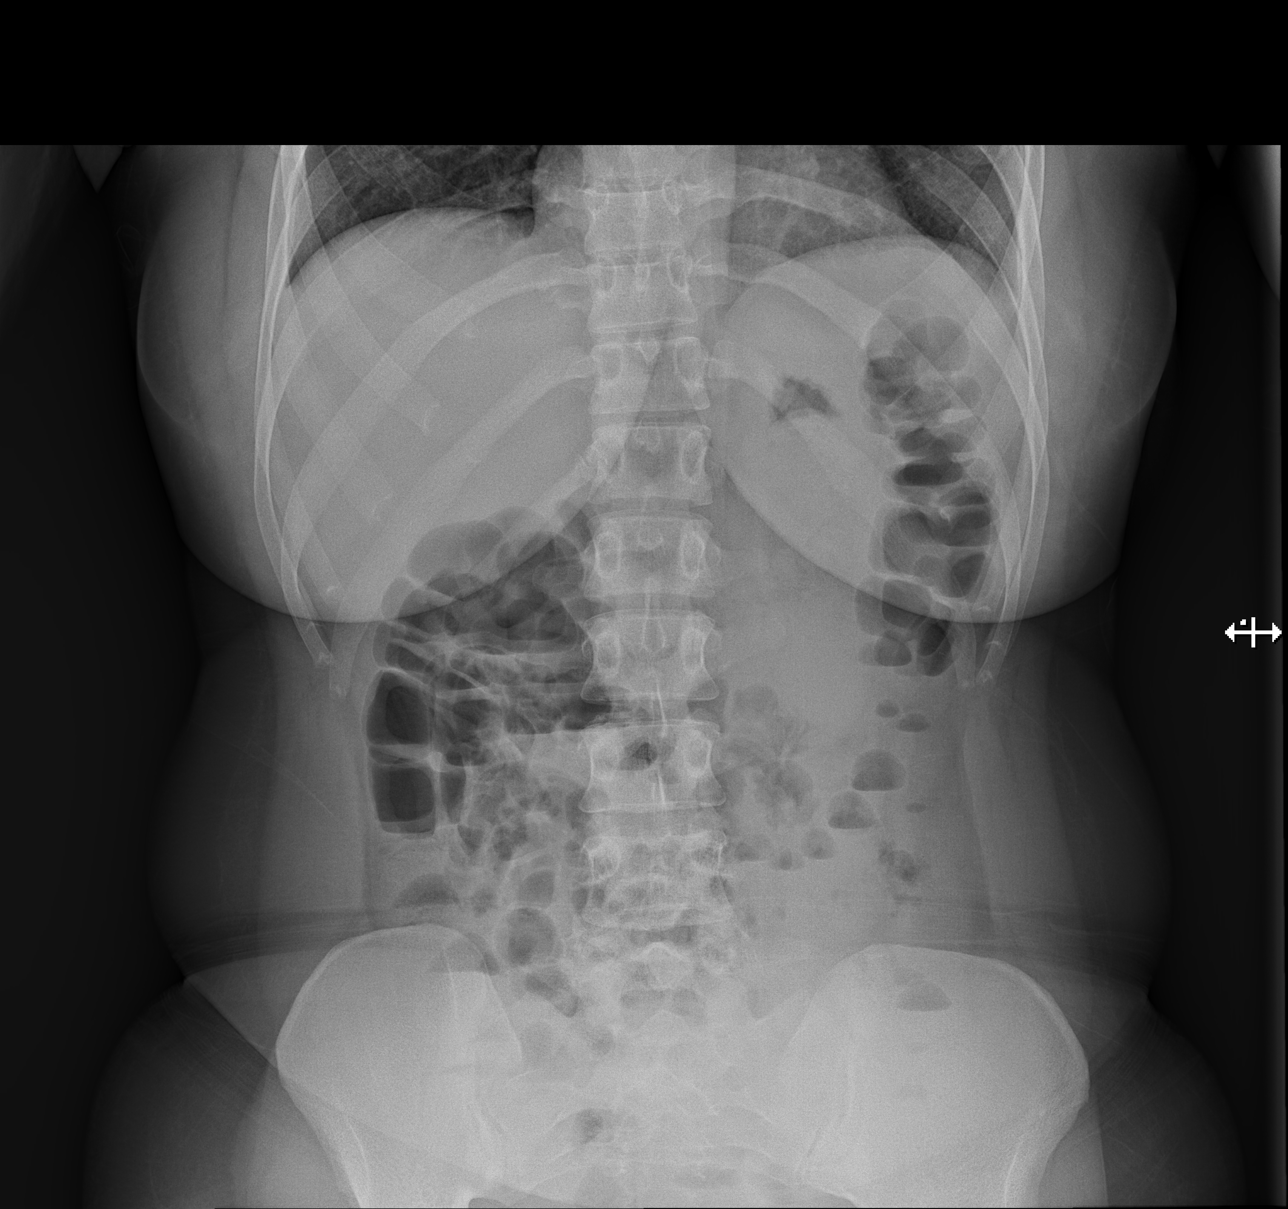

[t abdomen supine]
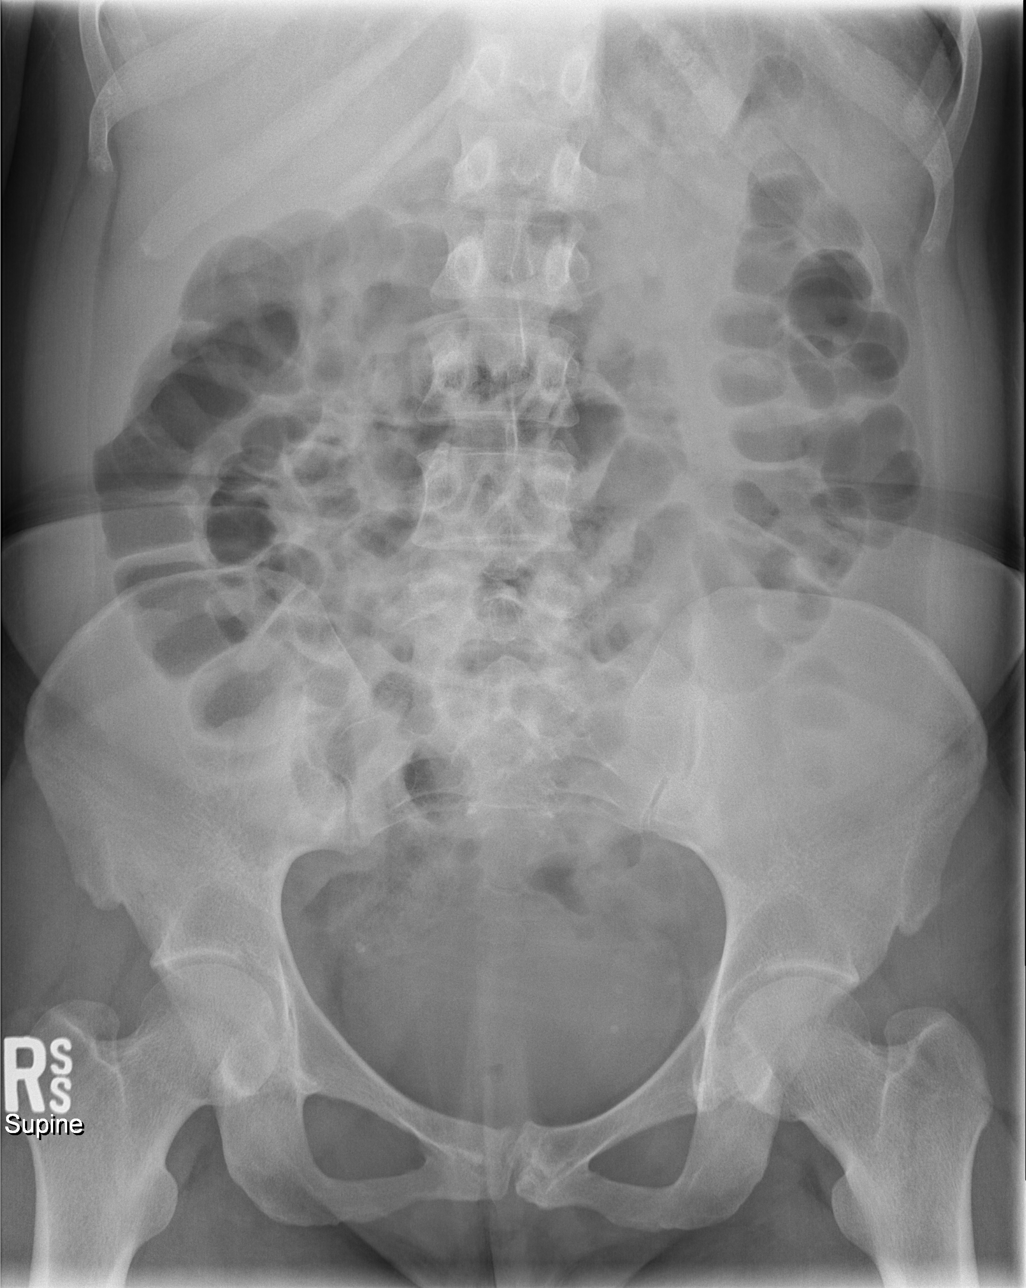

[3 of 3 positions shown; findings below may reference images not displayed]

FINDINGS: The lungs are clear. There is no pleural effusion or pneumothorax.
The cardiac silhouette is within normal limits. No acute osseous
pathology.

There is no bowel dilatation or evidence of obstruction. Air is
noted throughout the colon. There is liquid content with air-fluid
level within the colon compatible with diarrheal state. Correlation
with clinical exam and stool cultures recommended. No radiopaque
calculi or foreign object. The soft tissues and osseous structures
appear unremarkable.
IMPRESSION: No acute cardiopulmonary process.

No evidence of bowel obstruction.

Diarrheal state.
